# Patient Record
Sex: Male | Born: 1995 | Race: White | Hispanic: Yes | Marital: Single | Smoking: Never smoker
Health system: Southern US, Community
[De-identification: ages and names within clinical notes are randomized; demographics above are authoritative.]

## PROBLEM LIST (undated history)

## (undated) HISTORY — PX: WISDOM TOOTH EXTRACTION: SHX21

---
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
## 2013-12-09 ENCOUNTER — Ambulatory Visit (INDEPENDENT_AMBULATORY_CARE_PROVIDER_SITE_OTHER): Payer: 59 | Admitting: Family Medicine

## 2013-12-09 ENCOUNTER — Encounter: Payer: Self-pay | Admitting: Family Medicine

## 2013-12-09 VITALS — BP 132/79 | HR 97 | Temp 100.0°F | Ht 67.0 in | Wt 144.0 lb

## 2013-12-09 DIAGNOSIS — B360 Pityriasis versicolor: Secondary | ICD-10-CM

## 2013-12-09 DIAGNOSIS — Z23 Encounter for immunization: Secondary | ICD-10-CM

## 2013-12-09 DIAGNOSIS — Z0000 Encounter for general adult medical examination without abnormal findings: Secondary | ICD-10-CM

## 2013-12-09 LAB — POCT GLYCOSYLATED HEMOGLOBIN (HGB A1C): Hemoglobin A1C: 5.4

## 2013-12-09 NOTE — Progress Notes (Signed)
   Subjective:    Patient ID: Samuel Ramirez, male    DOB: May 08, 1996, 18 y.o.   MRN: [id number]  HPI    Review of Systems  Constitutional: Negative.   HENT: Negative.   Eyes: Negative.   Respiratory: Negative.   Cardiovascular: Negative.   Gastrointestinal: Negative.   Endocrine: Negative.   Genitourinary: Negative.   Musculoskeletal: Negative.   Skin: Color change: pigment on back is variable.  Psychiatric/Behavioral: Negative.   All other systems reviewed and are negative.      Objective:   Physical Exam  Constitutional: He is oriented to person, place, and time. He appears well-developed and well-nourished.  HENT:  Head: Normocephalic.  Right Ear: External ear normal.  Left Ear: External ear normal.  Nose: Nose normal.  Mouth/Throat: Oropharynx is clear and moist.  Eyes: Conjunctivae and EOM are normal. Pupils are equal, round, and reactive to light.  Neck: Normal range of motion. Neck supple.  Cardiovascular: Normal rate, regular rhythm, normal heart sounds and intact distal pulses.   Pulmonary/Chest: Effort normal and breath sounds normal.  Abdominal: Soft. Bowel sounds are normal.  Musculoskeletal: Normal range of motion.  Neurological: He is alert and oriented to person, place, and time.  Skin: Skin is warm and dry. Rash: does not appear to be acne; no lesions on face or chest.  Psychiatric: He has a normal mood and affect. His behavior is normal. Judgment and thought content normal.          Assessment & Plan:  1. Routine general medical examination at a health care facility Discussed self testicular exam - POCT glycosylated hemoglobin (Hb A1C)  2. Tinea versicolor Trial of [place] with instructions  Frederica KusterStephen M Miller MD

## 2013-12-09 NOTE — Patient Instructions (Signed)
Meningococcal Vaccine What You Need to Know WHAT IS MENINGOCOCCAL DISEASE?  Meningococcal disease is a serious bacterial illness. It is a leading cause of bacterial meningitis in children 2 through 20 years old in the [place]. Meningitis is an infection of the covering of the brain and the spinal cord.  Meningococcal disease also causes blood infections.  About 1,000 to 1,200 people get meningococcal disease each year in the [place] Even when they are treated with antibiotics, 10% to 15% of these people die. Of those who live, another 11% to 19% lose their arms or legs, have problems with their nervous systems, become deaf or mentally retarded, or suffer seizures or strokes.  Anyone can get meningococcal disease. But it is most common in infants less than 1 year of age and people 16 to 21 years. Children with certain medical conditions, such as lack of a spleen, have an increased risk of getting meningococcal disease. College freshmen living in dorms are also at increased risk.  Meningococcal infections can be treated with drugs such as penicillin. Still, many people who get the disease die from it, and many others are affected for life. This is why preventing the disease through use of meningococcal vaccine is important for people at highest risk. MENINGOCOCCAL VACCINE There are 2 kinds of meningococcal vaccines in the [place]  Meningococcal conjugate vaccine (MCV4) is the preferred vaccine for people 55 years of age and younger.  Meningococcal polysaccharide vaccine (MPSV4) has been available since the 1970s. It is the only meningococcal vaccine licensed for people older than 55. Both vaccines can prevent 4 types of meningococcal disease, including 2 of the 3 types most common in the [place] and a type that causes epidemics in [place]. There are other types of meningococcal disease; the vaccines do not protect against these.  WHO SHOULD GET MENINGOCOCCAL VACCINE AND WHEN? Routine  Vaccination  Two doses of MCV4 are recommended for adolescents 11 through 20 years of age: the first dose at 11 or 20 years of age, with a booster dose at age 16.  Adolescents in this age group with HIV infection should get 3 doses: 2 doses 2 months apart at 11 or 12 years, plus a booster at age 16.  If the first dose (or series) is given between 13 and 15 years of age, the booster should be given between 16 and 18. If the first dose (or series) is given after the 16th birthday, a booster is not needed. Other People at Increased Risk  College freshmen living in dormitories.  Laboratory personnel who are routinely exposed to meningococcal bacteria.  [place] military recruits.  Anyone traveling to, or living in, a part of the world where meningococcal disease is common, such as parts of [place].  Anyone who has a damaged spleen or whose spleen has been removed.  Anyone who has persistent complement component deficiency (an immune system disorder).  People who might have been exposed to meningitis during an outbreak. Children between 9 and 23 months of age, and anyone else with certain medical conditions need 2 doses for adequate protection. Ask your doctor about the number and timing of doses, and the need for booster doses. MCV4 is the preferred vaccine for people in these groups who are 9 months through 20 years of age. MPSV4 can be used for adults older than 55. SOME PEOPLE SHOULD NOT GET MENINGOCOCCAL VACCINE OR SHOULD WAIT  Anyone who has ever had a severe (life-threatening) allergic reaction to a previous dose of   MCV4 or MPSV4 vaccine should not get a dose of either vaccine.  Anyone who has a severe (life-threatening) allergy to any vaccine component should not get the vaccine. Tell your doctor if you have any severe allergies.  Anyone who is moderately or severely ill at the time the shot is scheduled should probably wait until they recover. Ask your doctor. People with a mild illness  can usually get the vaccine.  Meningococcal vaccines may be given to pregnant women. MCV4 is a fairly new vaccine and has not been studied in pregnant women as much as MPSV4 has. It should be used only if clearly needed. The manufacturers of MCV4 maintain pregnancy registries for women who are vaccinated while pregnant. Except for children with sickle cell disease or without a working spleen, meningococcal vaccines may be given at the same time as other vaccines. WHAT ARE THE RISKS FROM MENINGOCOCCAL VACCINE? A vaccine, like any medicine, could possibly cause serious problems, such as severe allergic reactions. The risk of meningococcal vaccine causing serious harm, or death, is extremely small. Brief fainting spells and related symptoms (such as jerking or seizure-like movements) can follow a vaccination. They happen most often with adolescents, and they can result in falls and injuries. Sitting or lying down for about 15 minutes after getting the shot, especially if you feel faint, can help prevent these injuries. Mild problems  As many as half the people who get meningococcal vaccines have mild side effects, such as redness or pain where the shot was given.  If these problems occur, they usually last for 1 or 2 days. They are more common after MCV4 than after MPSV4.  A small percentage of people who receive the vaccine develop a mild fever. Severe problems  Serious allergic reactions, within a few minutes to a few hours of the shot, are very rare. WHAT IF THERE IS A MODERATE OR SEVERE REACTION? What should I look for? Any unusual condition, such as a severe allergic reaction or a high fever. If a severe allergic reaction occurred, it would be within a few minutes to an hour after the shot. Signs of a serious allergic reaction can include difficulty breathing, weakness, hoarseness or wheezing, a fast heartbeat, hives, dizziness, paleness, or swelling of the throat. What should I do?  Call  your doctor, or get the person to a doctor right away.  Tell the doctor what happened, the date and time it happened, and when the vaccination was given.  Ask the provider to report the reaction by filing a Vaccine Adverse Event Reporting System (VAERS) form. Or, you can file this report through the VAERS website at www.vaers.hhs.gov or by calling 1-800-822-7967. VAERS does not provide medical advice. THE NATIONAL VACCINE INJURY COMPENSATION PROGRAM The National Vaccine Injury Compensation Program (VICP) was created in 1986. Persons who believe they may have been injured by a vaccine can learn about the program and about filing a claim by calling 1-800-338-2382 or visiting the VICP website at www.hrsa.gov/vaccinecompensation HOW CAN I LEARN MORE?  Your doctor can give you the vaccine package insert or suggest other sources of information.  Call your local or state health department.  Contact the Centers for Disease Control and Prevention (CDC):  Call 1-800-232-4636 (1-800-CDC-INFO) or  Visit the CDC's website at www.cdc.gov/vaccines CDC Meningococcal Vaccine (Interim) VIS (03/05/2010) Document Released: 03/06/2006 Document Revised: 08/01/2011 Document Reviewed: 08/29/2012 Exit[employer] Patient Information 2015 [employer]. This information is not intended to replace advice given to you by your health care provider.   Make sure you discuss any questions you have with your health care provider. Human Papillomavirus (HPV) Gardasil Vaccine What You Need to Know WHAT IS HPV?  Genital human papillomavirus (HPV) is the most common sexually transmitted virus in the [place]. More than half of sexually active men and women are infected with HPV at some time in their lives.  About 20 million Americans are currently infected, and about 6 million more get infected each year. HPV is usually spread through sexual contact.  Most HPV infections do not cause any symptoms and go away on their own.  But HPV can cause cervical cancer in women. Cervical cancer is the 2nd leading cause of cancer deaths among women around the world. In the [place], about 12,000 women get cervical cancer every year and about 4,000 are expected to die from it.  HPV is also associated with several less common cancers, such as vaginal and vulvar cancers in women, and anal and oropharyngeal (back of the throat, including base of tongue and tonsils) cancers in both men and women. HPV can also cause genital warts and warts in the throat.  There is no cure for HPV infection, but some of the problems it causes can be treated. HPV VACCINE: WHY GET VACCINATED?  The HPV vaccine you are getting is 1 of 2 vaccines that can be given to prevent HPV. It may be given to both males and females.  This vaccine can prevent most cases of cervical cancer in females, if it is given before exposure to the virus. In addition, it can prevent vaginal and vulvar cancer in females, and genital warts and anal cancer in both males and females.  Protection from HPV vaccine is expected to be long-lasting. But vaccination is not a substitute for cervical cancer screening. Women should still get regular Pap tests. WHO SHOULD GET THIS HPV VACCINE AND WHEN? HPV vaccine is given as a 3-dose series.  1st Dose: Now.  2nd Dose: 1 to 2 months after Dose 1.  3rd Dose: 6 months after Dose 1. Additional (booster) doses are not recommended. Routine Vaccination This HPV vaccine is recommended for girls and boys 11 or 20 years of age. It may be given starting at age 9. Why is HPV vaccine recommended at 11 or 20 years of age?  HPV infection is easily acquired, even with only one sex partner. That is why it is important to get HPV vaccine before any sexual contact takes place. Also, response to the vaccine is better at this age than at older ages. Catch-Up Vaccination This vaccine is recommended for the following people who have not completed the  3-dose series:   Females 13 through 20 years of age.  Males 13 through 21 years of age. This vaccine may be given to men 22 through 20 years of age who have not completed the 3-dose series. It is recommended for men through age 26 who have sex with men or whose immune system is weakened because of HIV infection, other illness, or medications.  HPV vaccine may be given at the same time as other vaccines. SOME PEOPLE SHOULD NOT GET HPV VACCINE OR SHOULD WAIT  Anyone who has ever had a life-threatening allergic reaction to any other component of HPV vaccine, or to a previous dose of HPV vaccine, should not get the vaccine. Tell your doctor if the person getting vaccinated has any severe allergies, including an allergy to yeast.  HPV vaccine is not recommended for pregnant women. However,   receiving HPV vaccine when pregnant is not a reason to consider terminating the pregnancy. Women who are breastfeeding may get the vaccine.  People who are mildly ill when a dose of HPV is planned can still be vaccinated. People with a moderate or severe illness should wait until they are better. WHAT ARE THE RISKS FROM THIS VACCINE?  This HPV vaccine has been used in the [place] and around the world for about 6 years and has been very safe.  However, any medicine could possibly cause a serious problem, such as a severe allergic reaction. The risk of any vaccine causing a serious injury, or death, is extremely small.  Life-threatening allergic reactions from vaccines are very rare. If they do occur, it would be within a few minutes to a few hours after the vaccination. Several mild to moderate problems are known to occur with HPV vaccine. These do not last long and go away on their own.  Reactions in the arm where the shot was given:  Pain (about 8 people in 10).  Redness or swelling (about 1 person in 4).  Fever:  Mild (100 F or 37.8 C) (about 1 person in 10).  Moderate (102 F or 38.9 C) (about 1  person in 65).  Other problems:  Headache (about 1 person in 3).  Fainting: Brief fainting spells and related symptoms (such as jerking movements) can happen after any medical procedure, including vaccination. Sitting or lying down for about 15 minutes after a vaccination can help prevent fainting and injuries caused by falls. Tell your doctor if the patient feels dizzy or lightheaded, or has vision changes or ringing in the ears.  Like all vaccines, HPV vaccines will continue to be monitored for unusual or severe problems. WHAT IF THERE IS A SERIOUS REACTION? What should I look for?  Any unusual condition, such as a high fever or unusual behavior. Signs of a serious allergic reaction can include difficulty breathing, hoarseness or wheezing, hives, paleness, weakness, a fast heartbeat, or dizziness. What should I do?  Call a doctor, or get the person to a doctor right away.  Tell your doctor what happened, the date and time it happened, and when the vaccination was given.  Ask your doctor, nurse, or health department to report the reaction by filing a Vaccine Adverse Event Reporting System (VAERS) form. Or, you can file this report through the VAERS website at www.vaers.hhs.gov or by calling 1-800-822-7967. VAERS does not provide medical advice. THE NATIONAL VACCINE INJURY COMPENSATION PROGRAM  The National Vaccine Injury Compensation Program (VICP) is a federal program that was created to compensate people who may have been injured by certain vaccines.  Persons who believe they may have been injured by a vaccine can learn about the program and about filing a claim by calling 1-800-338-2382 or visiting the VICP website at www.hrsa.gov/vaccinecompensation HOW CAN I LEARN MORE?  Ask your doctor.  Call your local or state health department.  Contact the Centers for Disease Control and Prevention (CDC):  Call 1-800-232-4636 (1-800-CDC-INFO)  or  Visit CDC's website at  www.cdc.gov/vaccines CDC Human Papillomavirus (HPV) Gardasil (Interim) 10/07/11 Document Released: 03/06/2006 Document Revised: 02/27/2013 Document Reviewed: 08/28/2012 [employer] Patient Information 2015 [employer]. This information is not intended to replace advice given to you by your health care provider. Make sure you discuss any questions you have with your health care provider.  

## 2013-12-11 IMAGING — CR DG CERVICAL SPINE COMPLETE 4+V
5 series · 5 of 5 positions shown · non-contrast
Comparison: None.

CLINICAL DATA: Neck pain.

CERVICAL SPINE - COMPLETE 4+ VIEW

[view not recorded (1 of 5)]
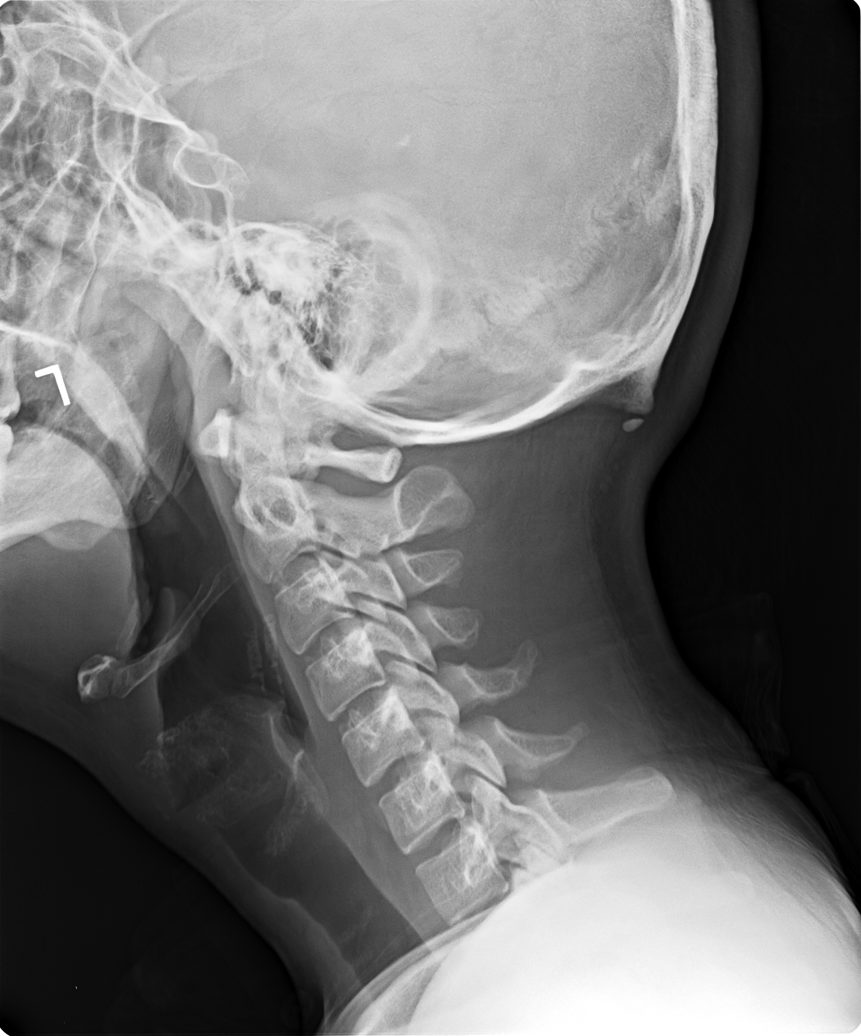

[view not recorded (2 of 5)]
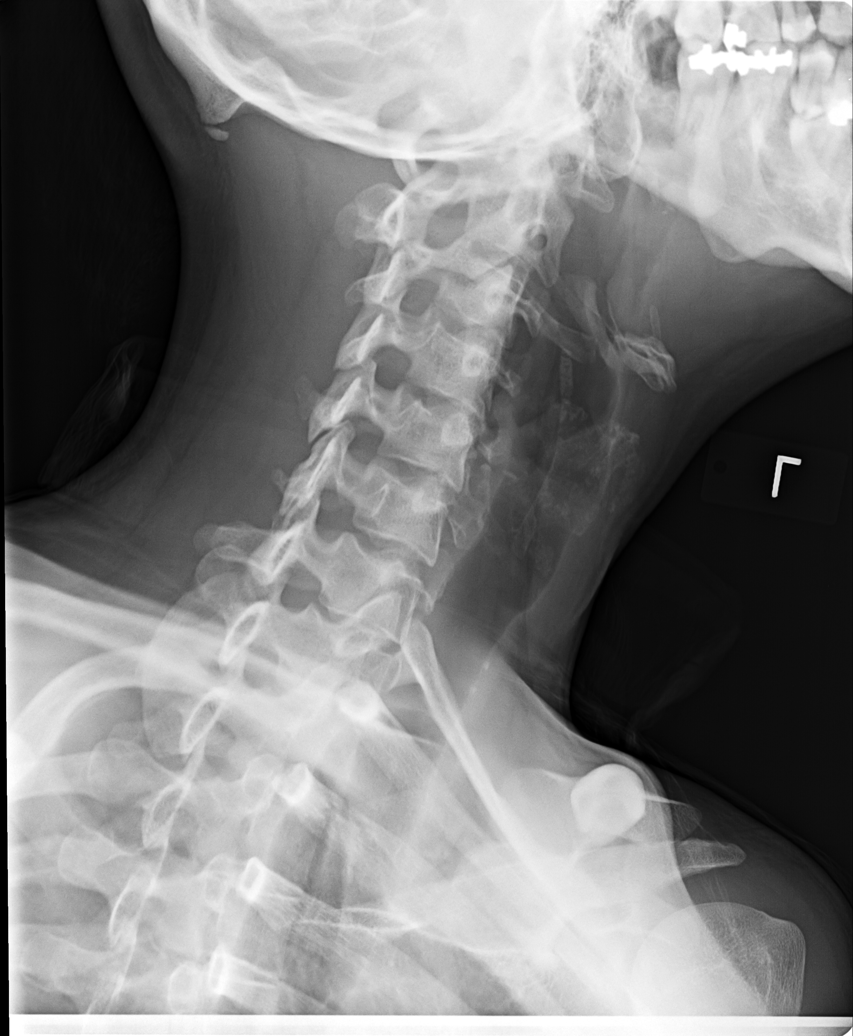

[view not recorded (3 of 5)]
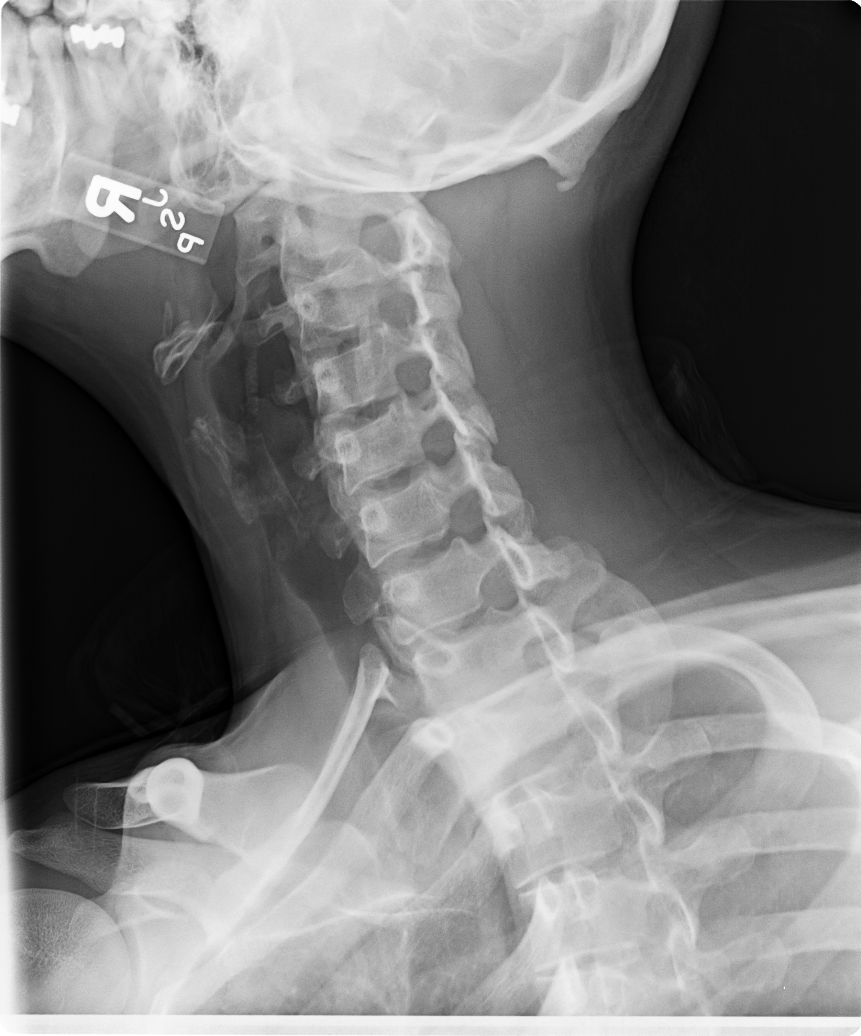

[view not recorded (4 of 5)]
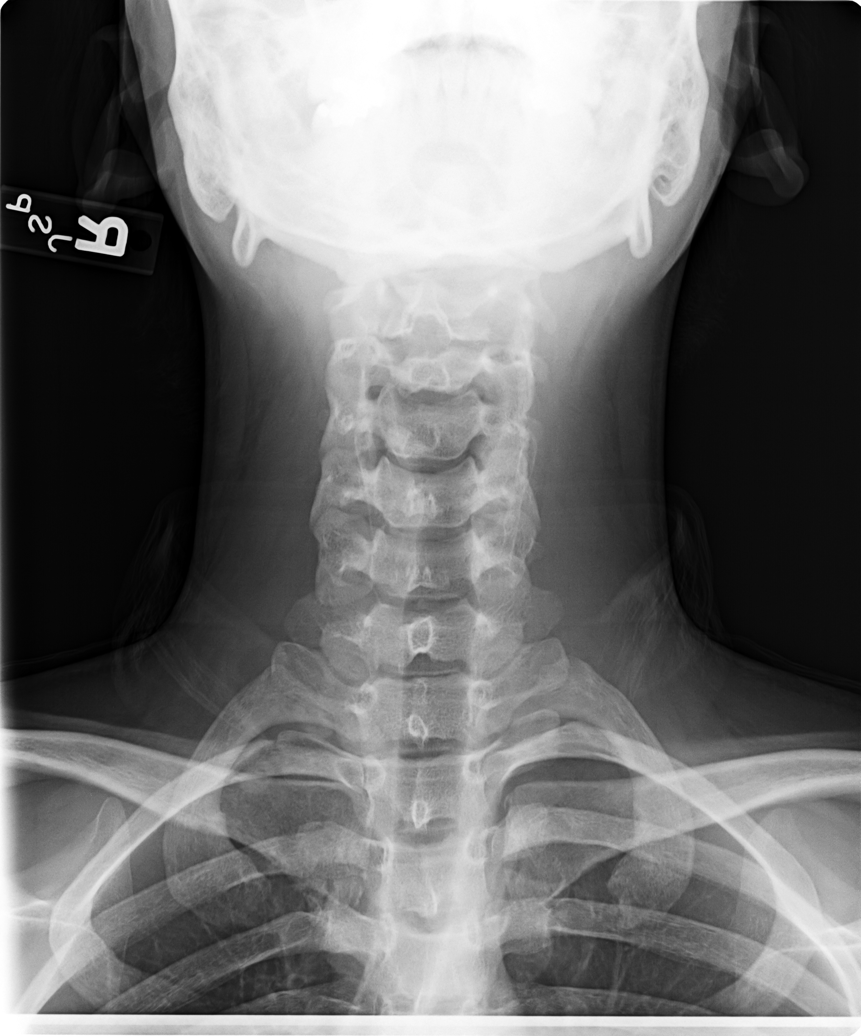

[view not recorded (5 of 5)]
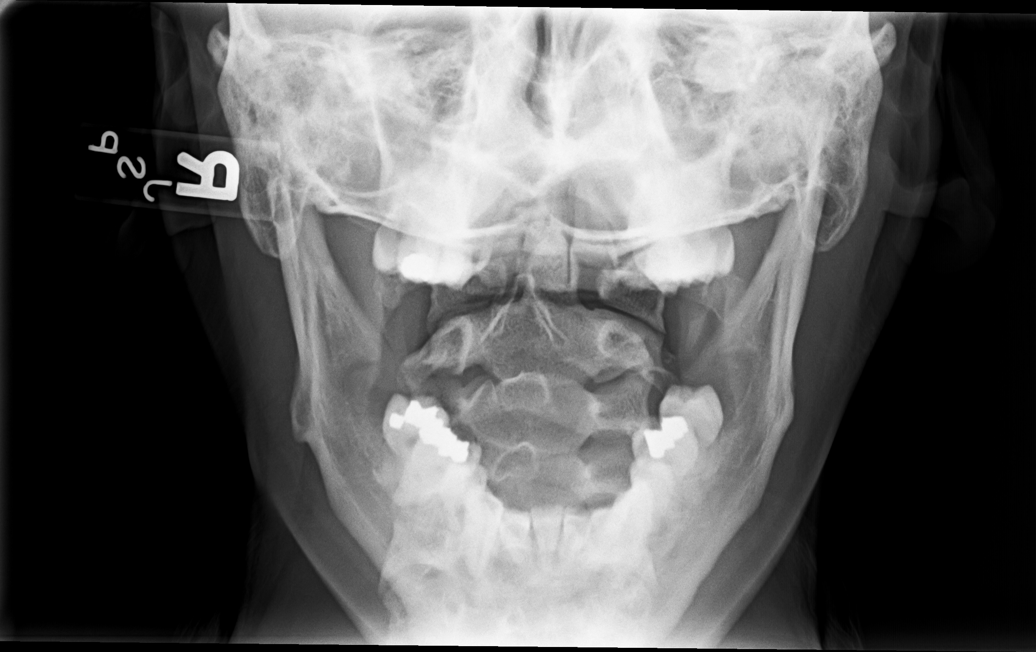

[5 of 5 positions shown; findings below may reference images not displayed]

FINDINGS: There is no visible cervical spine fracture or traumatic
subluxation.  There is no prevertebral soft tissue swelling.  The
alignment is anatomic.  Neural foramina are widely patent.  Lung
apices are clear.  Negative odontoid.
IMPRESSION: Negative exam.

## 2014-01-17 IMAGING — CT CT HEAD W/O CM
1 series · 16 of 30 positions shown, 20 images · non-contrast
Comparison: None.

CLINICAL DATA: Headache

CT HEAD WITHOUT CONTRAST
TECHNIQUE: Contiguous axial images were obtained from the base of
the skull through the vertex without contrast

[Series 2: headseq 4.8 h37s · axial · 0.43mm/px · z∈[+41,+198]mm · 16 of 36 slices shown, 20 images]
[im 2/36  brain]
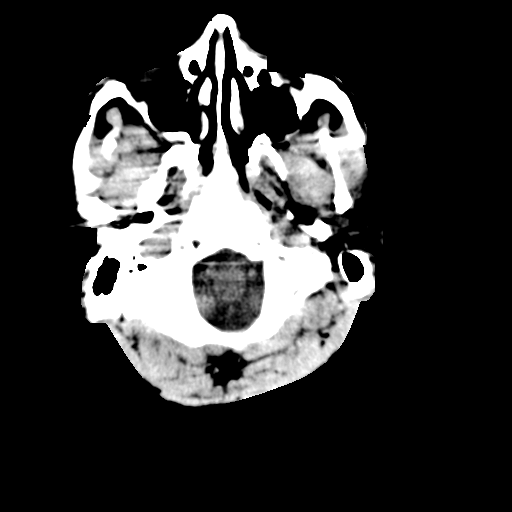
[im 2/36  bone]
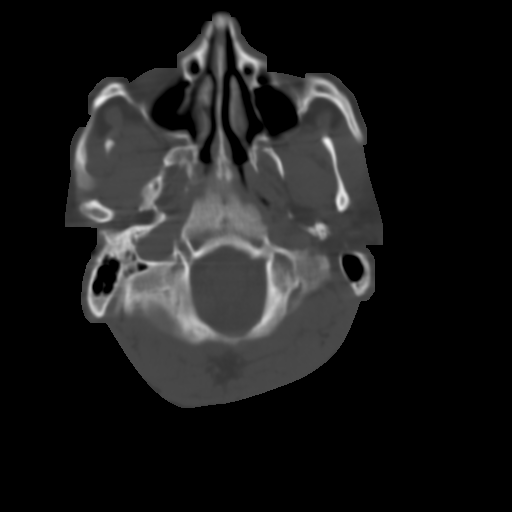
[im 4/36  brain]
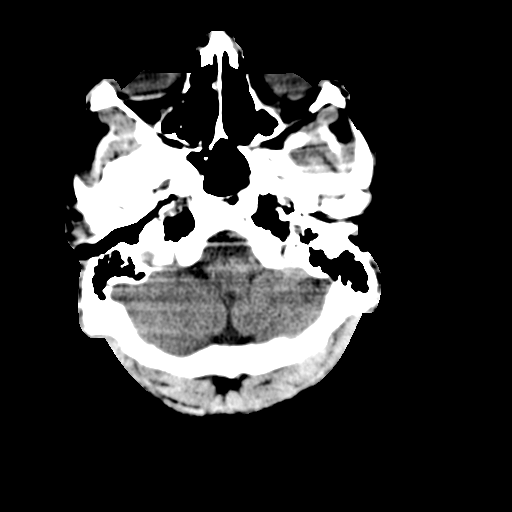
[im 7/36  brain]
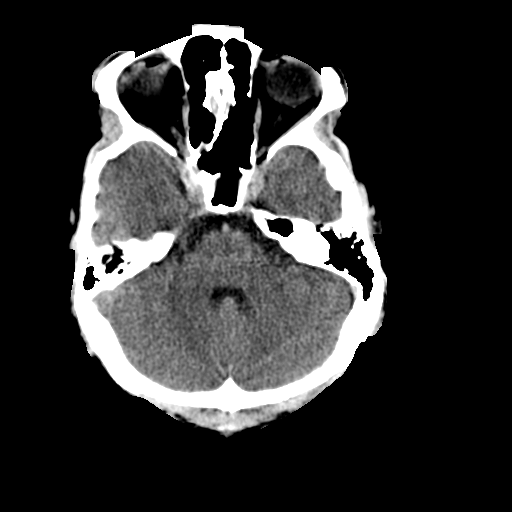
[im 9/36  brain]
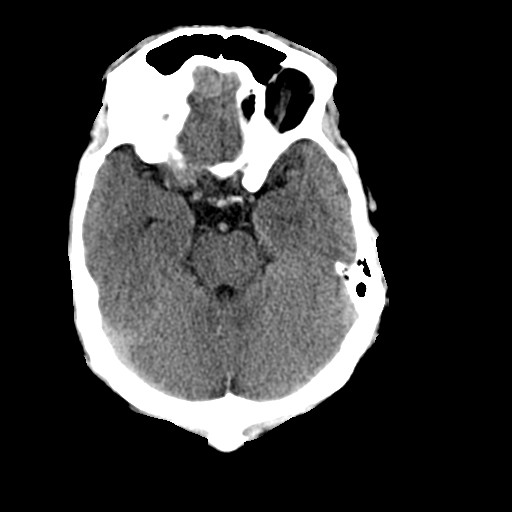
[im 10/36  brain]
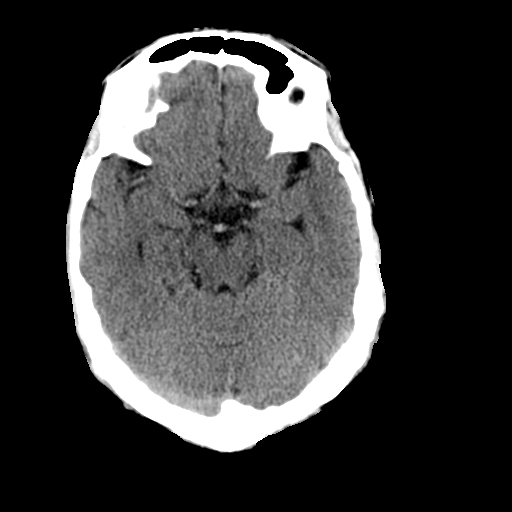
[im 10/36  bone]
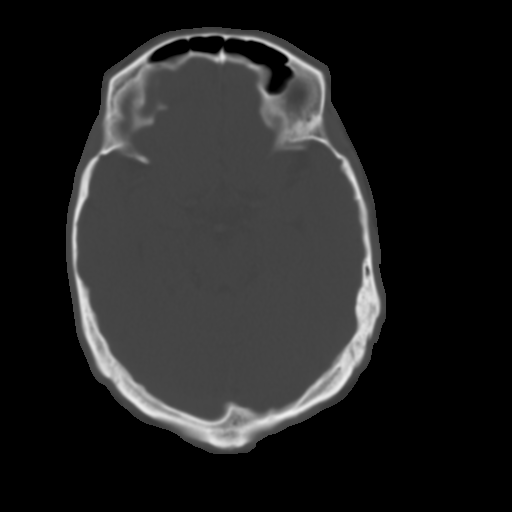
[im 13/36  brain]
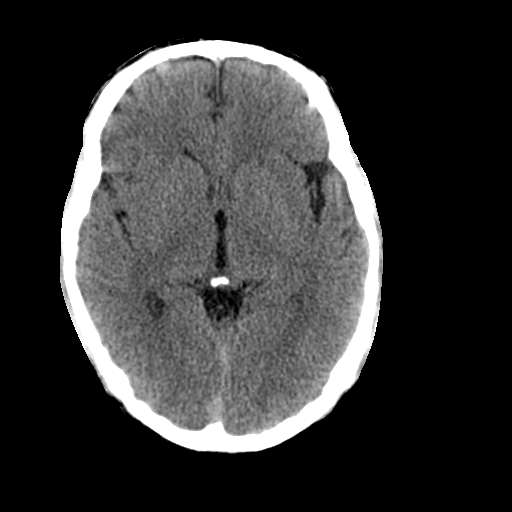
[im 15/36  brain]
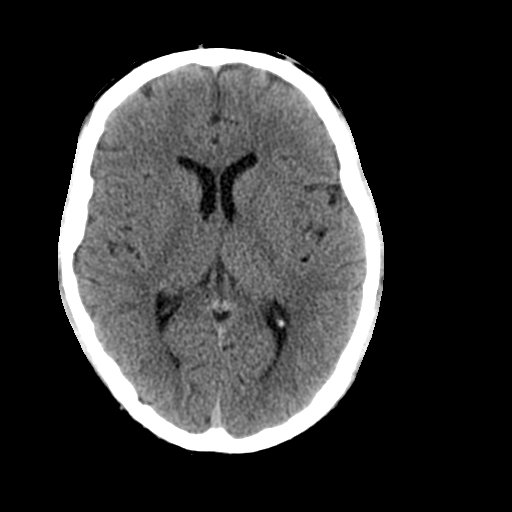
[im 17/36  brain]
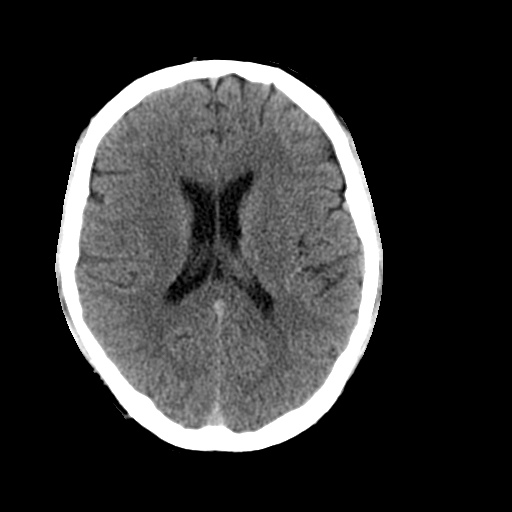
[im 19/36  brain]
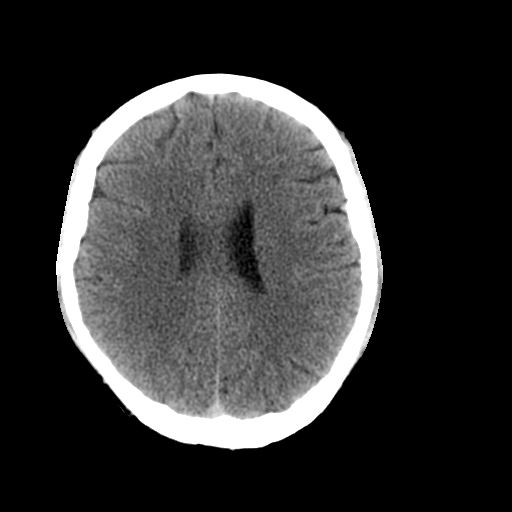
[im 19/36  bone]
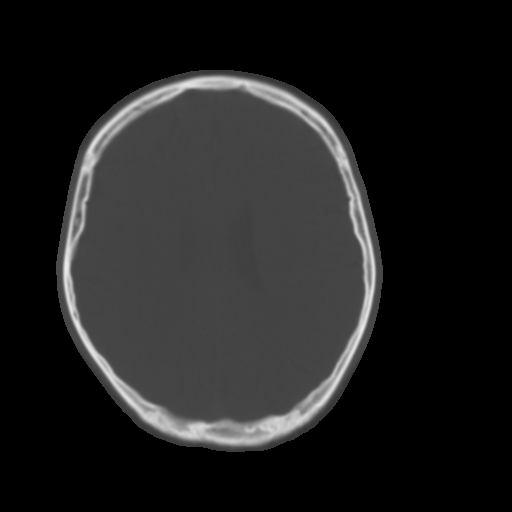
[im 21/36  brain]
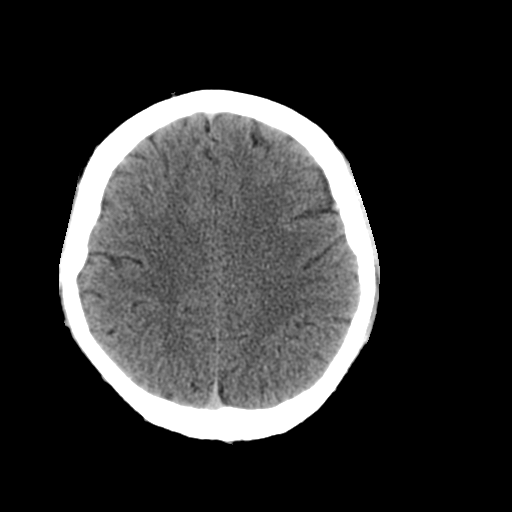
[im 23/36  brain]
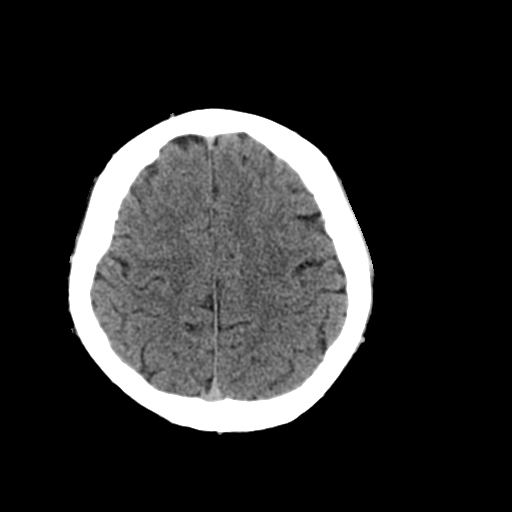
[im 26/36  brain]
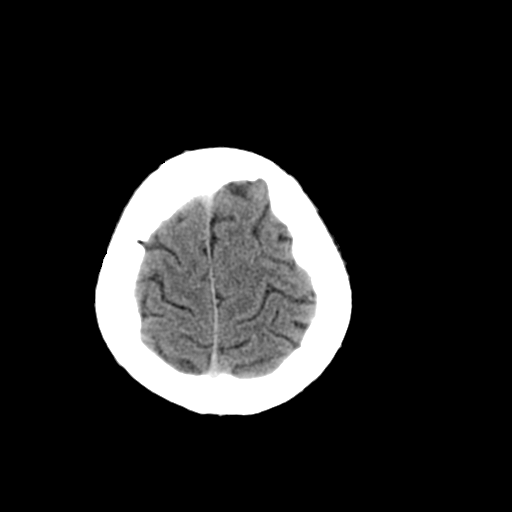
[im 27/36  brain]
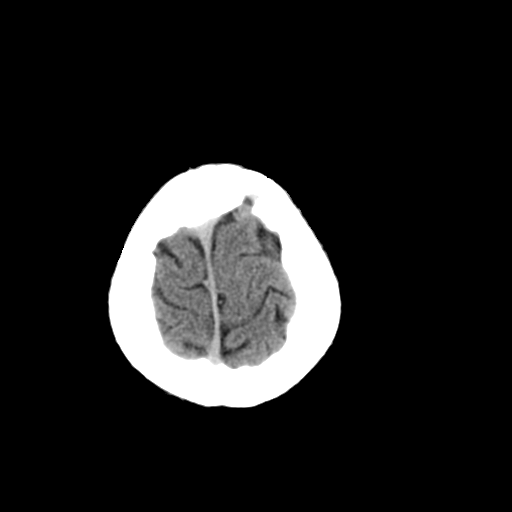
[im 27/36  bone]
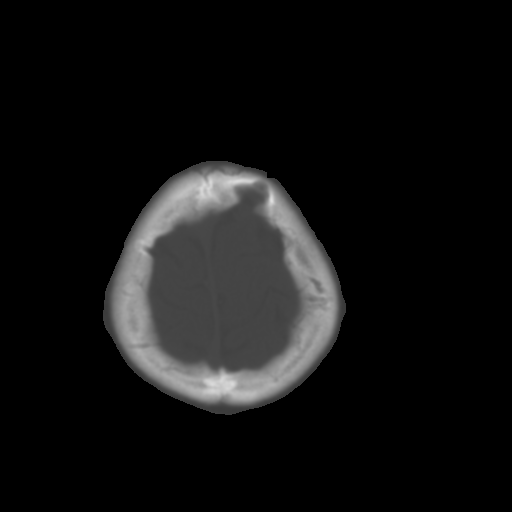
[im 29/36  brain]
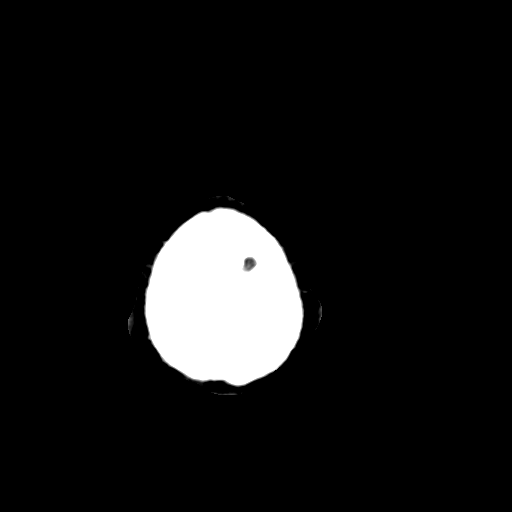
[im 32/36  brain]
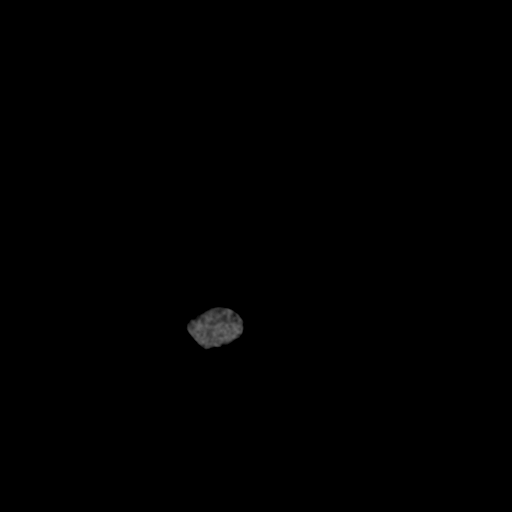
[im 34/36  brain]
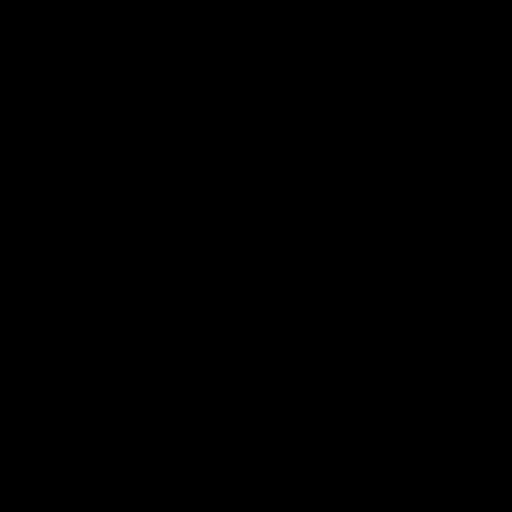

[16 of 30 positions shown; findings below may reference images not displayed]

FINDINGS: The brain has a normal appearance without evidence for
hemorrhage, acute infarction, hydrocephalus, or mass lesion.  There
is no extra axial fluid collection.  The skull and paranasal
sinuses are normal.
IMPRESSION: Normal CT of the head without contrast.

## 2014-02-10 ENCOUNTER — Ambulatory Visit (INDEPENDENT_AMBULATORY_CARE_PROVIDER_SITE_OTHER): Payer: 59 | Admitting: *Deleted

## 2014-02-10 DIAGNOSIS — Z23 Encounter for immunization: Secondary | ICD-10-CM

## 2014-02-10 NOTE — Patient Instructions (Signed)
HPV Vaccine Cervarix (Human Papillomavirus): What You Need to Know 1. What is HPV? Genital human papillomavirus (HPV) is the most common sexually transmitted virus in the [place]. More than half of sexually active men and women are infected with HPV at some time in their lives. About 20 million Americans are currently infected, and about 6 million more get infected each year. HPV is usually spread through sexual contact. Most HPV infections don't cause any symptoms, and go away on their own. But HPV can cause cervical cancer in women. Cervical cancer is the 2nd leading cause of cancer deaths among women around the world. In the [place], about 10,000 women get cervical cancer every year and about 4,000 are expected to die from it. HPV is also associated with several less common cancers, such as vaginal and vulvar cancers in women and other types of cancer in both men and women. It can also cause genital warts and warts in the throat. There is no cure for HPV infection, but some of the problems it causes can be treated. 2. HPV vaccine: Why get vaccinated? HPV vaccine is important because it can prevent most cases of cervical cancer in females, if it is given before a person is exposed to the virus. Protection from HPV vaccine is expected to be long-lasting. But vaccination is not a substitute for cervical cancer screening. Women should still get regular Pap tests. The vaccine you are getting is one of two HPV vaccines that can be given to prevent cervical cancer. It is given to females only. The other vaccine may be given to both males and females. It can also prevent most genital warts. It has also been shown to prevent some vaginal, vulvar and anal cancers. 3. Who should get this HPV vaccine and when? Routine vaccination  HPV vaccine is recommended for girls 7111 or 20 years of age. It may be given to girls starting at age 579. Why is HPV vaccine given to girls at this age? It is important  for girls to get HPV vaccine before their first sexual contact--because they won't have been exposed to human papillomavirus. Once a girl or woman has been infected with the virus, the vaccine might not work as well or might not work at all. Catch-up vaccination  The vaccine is also recommended for girls and women 6313 through 20 years of age who did not get all 3 doses when they were younger. HPV vaccine is given as a 3-dose series  1st Dose: Now  2nd Dose: 1 to 2 months after Dose 1  3rd Dose: 6 months after Dose 1 Additional (booster) doses are not recommended. HPV vaccine may be given at the same time as other vaccines. 4. Some people should not get HPV vaccine or should wait  Anyone who has ever had a life-threatening allergic reaction to any component of HPV vaccine, or to a previous dose of HPV vaccine, should not get the vaccine. Tell your doctor if the person getting vaccinated has any severe allergies, including an allergy to latex.  HPV vaccine is not recommended for pregnant women. However, receiving HPV vaccine when pregnant is not a reason to consider terminating the pregnancy. Women who are breast feeding may get the vaccine.  Any woman who learns she was pregnant when she got this HPV vaccine is encouraged to contact the manufacturer's HPV in pregnancy registry at [phone]. This will help us learn how pregnant women respond to the vaccine.  People who are mildly ill  when a dose of HPV is planned can still be vaccinated. People with a moderate or severe illness should wait until they are better. 5. What are the risks from this vaccine? This HPV vaccine has been in use around the world for several years and has been very safe. However, any medicine could possibly cause a serious problem, such as a severe allergic reaction. The risk of any vaccine causing a serious injury, or death, is extremely small. Life-threatening allergic reactions from vaccines are very rare. If they  do occur, it would be within a few minutes to a few hours after the vaccination. Several mild to moderate problems are known to occur with HPV vaccine. These do not last long and go away on their own.  Reactions where the shot was given:  Pain (about 9 people in 10)  Redness or swelling (about 1 person in 2)  Other mild reactions:  Fever of 99.5F or higher (about 1 person in 8)  Headache or fatigue (about 1 person in 2)  Nausea, vomiting, diarrhea, or abdominal pain (about 1 person in 4)  Muscle or joint pain (up to 1 person in 2)  Fainting:  Brief fainting spells and related symptoms (such as jerking movements) can happen after any medical procedure, including vaccination. Sitting or lying down for about 15 minutes after a vaccination can help prevent fainting and injuries caused by falls. Tell your doctor if the patient feels dizzy or light-headed, or has vision changes or ringing in the ears.  Like all vaccines, HPV vaccines will continue to be monitored for unusual or severe problems. 6. What if there is a serious reaction? What should I look for?  Look for anything that concerns you, such as signs of a severe allergic reaction, very high fever, or behavior changes. Signs of a severe allergic reaction can include hives, swelling of the face and throat, difficulty breathing, a fast heartbeat, dizziness, and weakness. These would start a few minutes to a few hours after the vaccination.  What should I do?  If you think it is a severe allergic reaction or other emergency that can't wait, call 9-1-1 or get the person to the nearest hospital. Otherwise, call your doctor.  Afterward, the reaction should be reported to the Vaccine Adverse Event Reporting System (VAERS). Your doctor might file this report, or you can do it yourself through the VAERS web site at www.vaers.hhs.gov, or by calling 1-800-822-7967. VAERS is only for reporting reactions. They do not give medical advice. 7.  The National Vaccine Injury Compensation Program The National Vaccine Injury Compensation Program (VICP) is a federal program that was created to compensate people who may have been injured by certain vaccines. Persons who believe they may have been injured by a vaccine can learn about the program and about filing a claim by calling 1-800-338-2382 or visiting the VICP website at www.hrsa.gov/vaccinecompensation. 8. How can I learn more?  Ask your doctor.  Call your local or state health department.  Contact the Centers for Disease Control and Prevention (CDC):  Call 1-800-232-4636 (1-800-CDC-INFO) or  Visit CDC's website at  www.cdc.gov/vaccines CDC Human Papillomavirus (HPV) Vaccine Cervarix VIS (09/22/09) Document Released: 09/25/2008 Document Revised: 09/23/2013 Document Reviewed: 06/20/2013 ExitCare Patient Information 2015 [employer]. This information is not intended to replace advice given to you by your health care provider. Make sure you discuss any questions you have with your health care provider.  

## 2014-02-10 NOTE — Progress Notes (Signed)
Patient ID: Samuel Ramirez, male   DOB: [birth year], 18 y.o.   MRN: [id number] Hpv vaccine given and tolerated well.

## 2014-06-12 ENCOUNTER — Ambulatory Visit (INDEPENDENT_AMBULATORY_CARE_PROVIDER_SITE_OTHER): Payer: 59 | Admitting: *Deleted

## 2014-06-12 DIAGNOSIS — Z23 Encounter for immunization: Secondary | ICD-10-CM

## 2014-09-12 ENCOUNTER — Ambulatory Visit (INDEPENDENT_AMBULATORY_CARE_PROVIDER_SITE_OTHER): Payer: 59 | Admitting: *Deleted

## 2014-09-12 DIAGNOSIS — Z23 Encounter for immunization: Secondary | ICD-10-CM | POA: Diagnosis not present

## 2014-09-12 NOTE — Patient Instructions (Signed)
Td Vaccine (Tetanus and Diphtheria): What You Need to Know 1. Why get vaccinated? Tetanus  and diphtheria are very serious diseases. They are rare in the [place] today, but people who do become infected often have severe complications. Td vaccine is used to protect adolescents and adults from both of these diseases. Both tetanus and diphtheria are infections caused by bacteria. Diphtheria spreads from person to person through coughing or sneezing. Tetanus-causing bacteria enter the body through cuts, scratches, or wounds. TETANUS (Lockjaw) causes painful muscle tightening and stiffness, usually all over the body.  It can lead to tightening of muscles in the head and neck so you can't open your mouth, swallow, or sometimes even breathe. Tetanus kills about 1 out of every 5 people who are infected. DIPHTHERIA can cause a thick coating to form in the back of the throat.  It can lead to breathing problems, paralysis, heart failure, and death. Before vaccines, the [place] saw as many as 200,000 cases a year of diphtheria and hundreds of cases of tetanus. Since vaccination began, cases of both diseases have dropped by about 99%. 2. Td vaccine Td vaccine can protect adolescents and adults from tetanus and diphtheria. Td is usually given as a booster dose every 10 years but it can also be given earlier after a severe and dirty wound or burn. Your doctor can give you more information. Td may safely be given at the same time as other vaccines. 3. Some people should not get this vaccine  If you ever had a life-threatening allergic reaction after a dose of any tetanus or diphtheria containing vaccine, OR if you have a severe allergy to any part of this vaccine, you should not get Td. Tell your doctor if you have any severe allergies.  Talk to your doctor if you:  have epilepsy or another nervous system problem,  had severe pain or swelling after any vaccine containing diphtheria or  tetanus,  ever had Guillain Barr Syndrome (GBS),  aren't feeling well on the day the shot is scheduled. 4. Risks of a vaccine reaction With a vaccine, like any medicine, there is a chance of side effects. These are usually mild and go away on their own. Serious side effects are also possible, but are very rare. Most people who get Td vaccine do not have any problems with it. Mild Problems  following Td (Did not interfere with activities)  Pain where the shot was given (about 8 people in 10)  Redness or swelling where the shot was given (about 1 person in 3)  Mild fever (about 1 person in 15)  Headache or Tiredness (uncommon) Moderate Problems following Td (Interfered with activities, but did not require medical attention)  Fever over 102F (rare) Severe Problems  following Td (Unable to perform usual activities; required medical attention)  Swelling, severe pain, bleeding and/or redness in the arm where the shot was given (rare). Problems that could happen after any vaccine:  Brief fainting spells can happen after any medical procedure, including vaccination. Sitting or lying down for about 15 minutes can help prevent fainting, and injuries caused by a fall. Tell your doctor if you feel dizzy, or have vision changes or ringing in the ears.  Severe shoulder pain and reduced range of motion in the arm where a shot was given can happen, very rarely, after a vaccination.  Severe allergic reactions from a vaccine are very rare, estimated at less than 1 in a million doses. If one were to occur,   it would usually be within a few minutes to a few hours after the vaccination. 5. What if there is a serious reaction? What should I look for?  Look for anything that concerns you, such as signs of a severe allergic reaction, very high fever, or behavior changes. Signs of a severe allergic reaction can include hives, swelling of the face and throat, difficulty breathing, a fast heartbeat,  dizziness, and weakness. These would usually start a few minutes to a few hours after the vaccination. What should I do?  If you think it is a severe allergic reaction or other emergency that can't wait, call 9-1-1 or get the person to the nearest hospital. Otherwise, call your doctor.  Afterward, the reaction should be reported to the Vaccine Adverse Event Reporting System (VAERS). Your doctor might file this report, or you can do it yourself through the VAERS web site at www.vaers.hhs.gov, or by calling 1-800-822-7967. VAERS is only for reporting reactions. They do not give medical advice. 6. The National Vaccine Injury Compensation Program The National Vaccine Injury Compensation Program (VICP) is a federal program that was created to compensate people who may have been injured by certain vaccines. Persons who believe they may have been injured by a vaccine can learn about the program and about filing a claim by calling 1-800-338-2382 or visiting the VICP website at www.hrsa.gov/vaccinecompensation. 7. How can I learn more?  Ask your doctor.  Contact your local or state health department.  Contact the Centers for Disease Control and Prevention (CDC):  Call 1-800-232-4636 (1-800-CDC-INFO)  Visit CDC's website at www.cdc.gov/vaccines CDC Td Vaccine Interim VIS (06/26/12) Document Released: 03/06/2006 Document Revised: 09/23/2013 Document Reviewed: 08/21/2013 ExitCare Patient Information 2015 [employer]. This information is not intended to replace advice given to you by your health care provider. Make sure you discuss any questions you have with your health care provider.  

## 2014-09-12 NOTE — Progress Notes (Signed)
Tenivac given and tolerated well. 

## 2016-05-17 NOTE — Progress Notes (Signed)
   Subjective:    Patient ID: Samuel Ramirez, male    DOB: [birth year], 20 y.o.   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 He presents today requesting a physical. He has no specific complaints but would like to have testing done for STDs. Her no symptoms.  There are no active problems to display for this patient.  No outpatient encounter prescriptions on file as of 05/18/2016.   No facility-administered encounter medications on file as of 05/18/2016.       Review of Systems  Constitutional: Negative.   HENT: Negative.   Eyes: Negative.   Respiratory: Negative.  Negative for shortness of breath.   Cardiovascular: Negative.  Negative for chest pain and leg swelling.  Gastrointestinal: Negative.   Genitourinary: Negative.   Musculoskeletal: Negative.   Skin: Negative.   Neurological: Negative.   Psychiatric/Behavioral: Negative.   All other systems reviewed and are negative.      Objective:   Physical Exam  Constitutional: He is oriented to person, place, and time. He appears well-developed and well-nourished.  HENT:  Head: Normocephalic.  Right Ear: External ear normal.  Left Ear: External ear normal.  Nose: Nose normal.  Mouth/Throat: Oropharynx is clear and moist.  Eyes: Conjunctivae and EOM are normal. Pupils are equal, round, and reactive to light.  Neck: Normal range of motion. Neck supple.  Cardiovascular: Normal rate, regular rhythm, normal heart sounds and intact distal pulses.   Pulmonary/Chest: Effort normal and breath sounds normal.  Abdominal: Soft. Bowel sounds are normal.  Musculoskeletal: Normal range of motion.  Neurological: He is alert and oriented to person, place, and time.  Skin: Skin is warm and dry.  Psychiatric: He has a normal mood and affect. His behavior is normal. Judgment and thought content normal.   BP 127/89   Pulse (!) 108   Temp 99.8 F (37.7  C) (Oral)   Ht _0  (1.676 m)   Wt 138 lb 12.8 oz (63 kg)   BMI 22.40 kg/m         Assessment & Plan:  1. Routine medical exam Exam within normal limits routine lab work plus STD check. - STD Screen (8) - BMP8+EGFR - Lipid panel  Wardell Honour MD

## 2016-05-18 ENCOUNTER — Encounter: Payer: Self-pay | Admitting: Family Medicine

## 2016-05-18 ENCOUNTER — Ambulatory Visit (INDEPENDENT_AMBULATORY_CARE_PROVIDER_SITE_OTHER): Payer: 59 | Admitting: Family Medicine

## 2016-05-18 VITALS — BP 127/89 | HR 108 | Temp 99.8°F | Ht 66.0 in | Wt 138.8 lb

## 2016-05-18 DIAGNOSIS — Z0000 Encounter for general adult medical examination without abnormal findings: Secondary | ICD-10-CM

## 2016-05-19 LAB — BMP8+EGFR
BUN/Creatinine Ratio: 15 (ref 9–20)
BUN: 12 mg/dL (ref 6–20)
CALCIUM: 9.9 mg/dL (ref 8.7–10.2)
CO2: 23 mmol/L (ref 18–29)
CREATININE: 0.81 mg/dL (ref 0.76–1.27)
Chloride: 99 mmol/L (ref 96–106)
GFR calc Af Amer: 146 mL/min/{1.73_m2} (ref >59)
GFR calc non Af Amer: 126 mL/min/{1.73_m2} (ref >59)
GLUCOSE: 109 mg/dL — AB (ref 65–99)
Potassium: 4.1 mmol/L (ref 3.5–5.2)
Sodium: 143 mmol/L (ref 134–144)

## 2016-05-19 LAB — STD SCREEN (8)
HEP A IGM: NEGATIVE
HEP B S AG: NEGATIVE
HIV Screen 4th Generation wRfx: NONREACTIVE
HSV 1 Glycoprotein G Ab, IgG: 37.1 index — ABNORMAL HIGH (ref 0.00–0.90)
HSV 2 Glycoprotein G Ab, IgG: <0.91 index (ref 0.00–0.90)
Hep B C IgM: NEGATIVE
Hep C Virus Ab: <0.1 s/co ratio (ref 0.0–0.9)
RPR Ser Ql: NONREACTIVE

## 2016-05-19 LAB — LIPID PANEL
CHOLESTEROL TOTAL: 238 mg/dL — AB (ref 100–199)
Chol/HDL Ratio: 5.8 ratio units — ABNORMAL HIGH (ref 0.0–5.0)
HDL: 41 mg/dL (ref >39)
LDL Calculated: 127 mg/dL — ABNORMAL HIGH (ref 0–99)
TRIGLYCERIDES: 348 mg/dL — AB (ref 0–149)
VLDL CHOLESTEROL CAL: 70 mg/dL — AB (ref 5–40)

## 2016-11-18 ENCOUNTER — Encounter: Payer: Self-pay | Admitting: Family Medicine

## 2016-11-18 ENCOUNTER — Ambulatory Visit (INDEPENDENT_AMBULATORY_CARE_PROVIDER_SITE_OTHER): Payer: 59 | Admitting: Family Medicine

## 2016-11-18 VITALS — BP 130/81 | HR 110 | Temp 98.4°F | Ht 66.0 in | Wt 143.0 lb

## 2016-11-18 DIAGNOSIS — N62 Hypertrophy of breast: Secondary | ICD-10-CM | POA: Diagnosis not present

## 2016-11-18 DIAGNOSIS — Z113 Encounter for screening for infections with a predominantly sexual mode of transmission: Secondary | ICD-10-CM

## 2016-11-18 NOTE — Progress Notes (Signed)
   HPI  Patient presents today here for STD check and gynecomastia  Sexual activity Patient has had 3 male partners in the last 6 months, he is condoms every time. He does not have any symptoms of STI but is concerned and would like to be checked. Denies any testicular lumps or bumps, or any painful or concerning lesions in the groin area.  Gynecomastia Patient states it's been there "as long as he can remember". States that his sexual drive is slightly lower than the eyes his age, and erectile dysfunction. No tenderness or concerning lumps or bumps. Again noticed a 2 or lumps or bumps or concerns.  PMH: Smoking status noted ROS: Per HPI  Objective: BP 130/81   Pulse (!) 110   Temp 98.4 F (36.9 C) (Oral)   Ht 5\' 6"  (1.676 m)   Wt 143 lb (64.9 kg)   BMI 23.08 kg/m  Gen: NAD, alert, cooperative with exam HEENT: NCAT Chest wall- small, approx 3-4 cm breast tissue under aerola BL CV: RRR, good S1/S2, no murmur Resp: CTABL, no wheezes, non-labored Ext: No edema, warm Neuro: Alert and oriented, No gross deficits  Assessment and plan:  # Screening for STD Discussed safe sex, checking HIV, RPR, GC C. No concerning symptoms Return to clinic as needed  # Gynecomastia Mild to moderate disease, asymptomatic Discussed multiple reasons that gynecomastia could occur and offered full workup, however he would like to have only his testosterone checked at this time. Discussed referral to plastic surgery, after complete workup if he wanted to.   Orders Placed This Encounter  Procedures  . GC/Chlamydia Probe Amp  . RPR    Standing Status:   Future    Standing Expiration Date:   11/18/2017  . HIV antibody    Standing Status:   Future    Standing Expiration Date:   11/18/2017  . Testosterone    Standing Status:   Future    Standing Expiration Date:   11/18/2017     Murtis SinkSam Yavonne Kiss, MD [facility] 11/18/2016, 3:34 PM

## 2016-11-18 NOTE — Patient Instructions (Signed)
Great to meet you!  We will notify you about your lab results within 1 week when you get them collected.   Return between 8 and 10 am to have them collected ( you do not need to be fasting)

## 2016-11-21 ENCOUNTER — Other Ambulatory Visit: Payer: 59

## 2016-11-21 DIAGNOSIS — N62 Hypertrophy of breast: Secondary | ICD-10-CM

## 2016-11-21 DIAGNOSIS — Z113 Encounter for screening for infections with a predominantly sexual mode of transmission: Secondary | ICD-10-CM

## 2016-11-21 LAB — GC/CHLAMYDIA PROBE AMP
Chlamydia trachomatis, NAA: NEGATIVE
Neisseria gonorrhoeae by PCR: NEGATIVE

## 2016-11-22 LAB — TESTOSTERONE: Testosterone: 492 ng/dL (ref 264–916)

## 2016-11-22 LAB — HIV ANTIBODY (ROUTINE TESTING W REFLEX): HIV Screen 4th Generation wRfx: NONREACTIVE

## 2016-11-22 LAB — RPR: RPR Ser Ql: NONREACTIVE

## 2017-02-10 ENCOUNTER — Encounter: Payer: Self-pay | Admitting: *Deleted

## 2017-05-12 ENCOUNTER — Encounter: Payer: 59 | Admitting: Family Medicine

## 2017-05-19 ENCOUNTER — Encounter: Payer: 59 | Admitting: Family Medicine

## 2017-06-05 ENCOUNTER — Encounter: Payer: Self-pay | Admitting: Family Medicine

## 2017-06-05 ENCOUNTER — Ambulatory Visit (INDEPENDENT_AMBULATORY_CARE_PROVIDER_SITE_OTHER): Payer: 59 | Admitting: Family Medicine

## 2017-06-05 VITALS — BP 133/81 | HR 89 | Temp 98.0°F | Ht 66.5 in | Wt 140.8 lb

## 2017-06-05 DIAGNOSIS — Z0000 Encounter for general adult medical examination without abnormal findings: Secondary | ICD-10-CM | POA: Diagnosis not present

## 2017-06-05 DIAGNOSIS — N529 Male erectile dysfunction, unspecified: Secondary | ICD-10-CM

## 2017-06-05 NOTE — Progress Notes (Signed)
   HPI  Patient presents today for annual physical exam.  Patient reports STD screening as well, however he has no specific concerns. No specific symptoms of STI.  Patient had hypertriglyceridemia seen on screening labs last year.  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 He is active, running 2 or 3 days a week. He is not watching his diet.  Also mentions that he has had difficult time getting and maintaining an erection.   PMH: Smoking status noted ROS: Per HPI  Objective: BP 133/81   Pulse 89   Temp 98 F (36.7 C) (Oral)   Ht 5' 6.5" (1.689 m)   Wt 140 lb 12.8 oz (63.9 kg)   BMI 22.39 kg/m  Gen: NAD, alert, cooperative with exam HEENT: NCAT, PERRL CV: RRR, good S1/S2, no murmur Resp: CTABL, no wheezes, non-labored Abd: SNTND, BS present, no guarding or organomegaly Ext: No edema, warm Neuro: Alert and oriented, No gross deficits  Assessment and plan:  #Annual physical exam Normal exam, normal weight Labs today, discussed hypertriglyceridemia and therapeutic lifestyle changes  #Erectile dysfunction Mild, however unusual at age of 89. No obvious causes or reasons. Testosterone, offered urology referral    Orders Placed This Encounter  Procedures  . CBC with Differential/Platelet  . CMP14+EGFR  . Lipid panel  . TSH  . RPR  . HIV antibody  . Lyon, MD [facility] 06/05/2017, 10:30 AM

## 2017-06-05 NOTE — Patient Instructions (Signed)

## 2017-06-06 LAB — CMP14+EGFR
A/G RATIO: 1.7 (ref 1.2–2.2)
ALT: 17 IU/L (ref 0–44)
AST: 14 IU/L (ref 0–40)
Albumin: 4.7 g/dL (ref 3.5–5.5)
Alkaline Phosphatase: 107 IU/L (ref 39–117)
BUN/Creatinine Ratio: 13 (ref 9–20)
BUN: 11 mg/dL (ref 6–20)
Bilirubin Total: 0.3 mg/dL (ref 0.0–1.2)
CHLORIDE: 103 mmol/L (ref 96–106)
CO2: 27 mmol/L (ref 20–29)
Calcium: 9.8 mg/dL (ref 8.7–10.2)
Creatinine, Ser: 0.88 mg/dL (ref 0.76–1.27)
GFR calc Af Amer: 140 mL/min/{1.73_m2} (ref >59)
GFR calc non Af Amer: 121 mL/min/{1.73_m2} (ref >59)
GLOBULIN, TOTAL: 2.7 g/dL (ref 1.5–4.5)
Glucose: 100 mg/dL — ABNORMAL HIGH (ref 65–99)
POTASSIUM: 4.1 mmol/L (ref 3.5–5.2)
SODIUM: 142 mmol/L (ref 134–144)
Total Protein: 7.4 g/dL (ref 6.0–8.5)

## 2017-06-06 LAB — CBC WITH DIFFERENTIAL/PLATELET
Basophils Absolute: 0 10*3/uL (ref 0.0–0.2)
Basos: 0 %
EOS (ABSOLUTE): 0.1 10*3/uL (ref 0.0–0.4)
EOS: 2 %
HEMATOCRIT: 45.4 % (ref 37.5–51.0)
Hemoglobin: 15.8 g/dL (ref 13.0–17.7)
IMMATURE GRANULOCYTES: 0 %
Immature Grans (Abs): 0 10*3/uL (ref 0.0–0.1)
LYMPHS ABS: 2.5 10*3/uL (ref 0.7–3.1)
Lymphs: 50 %
MCH: 29.9 pg (ref 26.6–33.0)
MCHC: 34.8 g/dL (ref 31.5–35.7)
MCV: 86 fL (ref 79–97)
MONOS ABS: 0.3 10*3/uL (ref 0.1–0.9)
Monocytes: 6 %
NEUTROS PCT: 42 %
Neutrophils Absolute: 2.1 10*3/uL (ref 1.4–7.0)
PLATELETS: 170 10*3/uL (ref 150–379)
RBC: 5.28 x10E6/uL (ref 4.14–5.80)
RDW: 13.9 % (ref 12.3–15.4)
WBC: 5 10*3/uL (ref 3.4–10.8)

## 2017-06-06 LAB — LIPID PANEL
Chol/HDL Ratio: 4.5 ratio (ref 0.0–5.0)
Cholesterol, Total: 157 mg/dL (ref 100–199)
HDL: 35 mg/dL — AB (ref >39)
LDL Calculated: 78 mg/dL (ref 0–99)
TRIGLYCERIDES: 221 mg/dL — AB (ref 0–149)
VLDL Cholesterol Cal: 44 mg/dL — ABNORMAL HIGH (ref 5–40)

## 2017-06-06 LAB — RPR: RPR: NONREACTIVE

## 2017-06-06 LAB — HIV ANTIBODY (ROUTINE TESTING W REFLEX): HIV SCREEN 4TH GENERATION: NONREACTIVE

## 2017-06-06 LAB — TESTOSTERONE: Testosterone: 612 ng/dL (ref 264–916)

## 2017-06-06 LAB — TSH: TSH: 4 u[IU]/mL (ref 0.450–4.500)
